# Patient Record
Sex: Female | Born: 1995 | Race: Black or African American | Hispanic: No | Marital: Single | Smoking: Never smoker
Health system: Southern US, Community
[De-identification: ages and names within clinical notes are randomized; demographics above are authoritative.]

## PROBLEM LIST (undated history)

## (undated) DIAGNOSIS — N83209 Unspecified ovarian cyst, unspecified side: Secondary | ICD-10-CM

## (undated) DIAGNOSIS — F419 Anxiety disorder, unspecified: Secondary | ICD-10-CM

## (undated) DIAGNOSIS — F329 Major depressive disorder, single episode, unspecified: Secondary | ICD-10-CM

## (undated) DIAGNOSIS — F32A Depression, unspecified: Secondary | ICD-10-CM

## (undated) DIAGNOSIS — J039 Acute tonsillitis, unspecified: Secondary | ICD-10-CM

## (undated) DIAGNOSIS — J45909 Unspecified asthma, uncomplicated: Secondary | ICD-10-CM

## (undated) DIAGNOSIS — G43909 Migraine, unspecified, not intractable, without status migrainosus: Secondary | ICD-10-CM

## (undated) HISTORY — PX: WISDOM TOOTH EXTRACTION: SHX21

## (undated) HISTORY — DX: Migraine, unspecified, not intractable, without status migrainosus: G43.909

## (undated) HISTORY — DX: Acute tonsillitis, unspecified: J03.90

---
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
## 2016-11-06 ENCOUNTER — Other Ambulatory Visit: Payer: Self-pay | Admitting: Nurse Practitioner

## 2016-11-06 DIAGNOSIS — R1032 Left lower quadrant pain: Secondary | ICD-10-CM

## 2016-11-10 ENCOUNTER — Ambulatory Visit
Admission: RE | Admit: 2016-11-10 | Discharge: 2016-11-10 | Disposition: A | Discharge status: Home / Self Care | Payer: Managed Care, Other (non HMO) | Source: Ambulatory Visit | Attending: Nurse Practitioner | Admitting: Nurse Practitioner

## 2016-11-10 ENCOUNTER — Other Ambulatory Visit: Payer: Self-pay | Admitting: Nurse Practitioner

## 2016-11-10 DIAGNOSIS — R1032 Left lower quadrant pain: Secondary | ICD-10-CM

## 2016-11-15 ENCOUNTER — Ambulatory Visit
Admission: RE | Admit: 2016-11-15 | Discharge: 2016-11-15 | Disposition: A | Discharge status: Home / Self Care | Payer: Managed Care, Other (non HMO) | Source: Ambulatory Visit | Attending: Nurse Practitioner | Admitting: Nurse Practitioner

## 2016-11-15 DIAGNOSIS — R1032 Left lower quadrant pain: Secondary | ICD-10-CM

## 2017-04-10 ENCOUNTER — Encounter (HOSPITAL_COMMUNITY): Payer: Self-pay | Admitting: Family Medicine

## 2017-04-10 ENCOUNTER — Ambulatory Visit (HOSPITAL_COMMUNITY)
Admission: EM | Admit: 2017-04-10 | Discharge: 2017-04-10 | Disposition: A | Discharge status: Home / Self Care | Payer: Managed Care, Other (non HMO) | Attending: Family Medicine | Admitting: Family Medicine

## 2017-04-10 ENCOUNTER — Other Ambulatory Visit: Payer: Self-pay

## 2017-04-10 DIAGNOSIS — M79672 Pain in left foot: Secondary | ICD-10-CM

## 2017-04-10 MED ORDER — PREDNISONE 20 MG PO TABS: ORAL_TABLET | ORAL | 0 refills | Status: AC

## 2017-04-10 NOTE — ED Provider Notes (Signed)
[facility]   [id number] 04/10/17 Arrival Time: 1310   SUBJECTIVE:  Azucena FallenBriana Sabic is a 22 y.o. female who presents to the urgent care with complaint of right foot pain.  Onset was three days ago.  No trauma.  Location below medial malleolus.  Tender to touch.  Patient works at a [employer] and is on her feet much of the day.  Past Medical History:  Diagnosis Date  . Anxiety   . Asthma   . Depression   . Migraine   . Tonsillitis 10/2014   Family History  Problem Relation Age of Onset  . Hypertension Mother   . Cancer Maternal Aunt        breast  . Diabetes Paternal Grandfather    Social History   Socioeconomic History  . Marital status: Single    Spouse name: Not on file  . Number of children: 0  . Years of education: Not on file  . Highest education level: Not on file  Occupational History    Comment: college student, [employer]  Social Needs  . Financial resource strain: Not on file  . Food insecurity:    Worry: Not on file    Inability: Not on file  . Transportation needs:    Medical: Not on file    Non-medical: Not on file  Tobacco Use  . Smoking status: Never Smoker  . Smokeless tobacco: Never Used  Substance and Sexual Activity  . Alcohol use: No    Comment: socially  . Drug use: No    Comment: 09/23/14 twice a year  . Sexual activity: Yes    Birth control/protection: Pill  Lifestyle  . Physical activity:    Days per week: Not on file    Minutes per session: Not on file  . Stress: Not on file  Relationships  . Social connections:    Talks on phone: Not on file    Gets together: Not on file    Attends religious service: Not on file    Active member of club or organization: Not on file    Attends meetings of clubs or organizations: Not on file    Relationship status: Not on file  . Intimate partner violence:    Fear of current or ex partner: Not on file    Emotionally abused: Not on file    Physically abused: Not on file    Forced  sexual activity: Not on file  Other Topics Concern  . Not on file  Social History Narrative   Lives on campus   Caffeine use- very little   No outpatient medications have been marked as taking for the 04/10/17 encounter [facility](Hospital Encounter).   Allergies  Allergen Reactions  . Clindamycin/Lincomycin Hives  . Peanut-Containing Drug Products Swelling    ALL NUTS Throat swelling, lip swelling, hives, vomiting      ROS: As per HPI, remainder of ROS negative.   OBJECTIVE:   Vitals:   04/10/17 1333  BP: (!) 142/101  Pulse: 85  Temp: 98.1 F (36.7 C)  TempSrc: Oral  SpO2: 100%     General appearance: alert; no distress Eyes: PERRL; EOMI; conjunctiva normal HENT: normocephalic; atraumatic; TMs normal, canal normal, external ears normal without trauma; nasal mucosa normal; oral mucosa normal Neck: supple Back: no CVA tenderness Extremities: no cyanosis or edema; symmetrical with no gross deformities Skin: warm and dry Neurologic: normal gait; grossly normal Psychological: alert and cooperative; normal mood and affect      Labs:  Results for orders placed or performed during the hospital encounter of 01/30/15  POC Urine Pregnancy, ED (do NOT order at [facility])  Result Value Ref Range   Preg Test, Ur NEGATIVE NEGATIVE    Labs Reviewed - No data to display  No results found.     ASSESSMENT & PLAN:  1. Foot pain, left     Meds ordered this encounter  Medications  . predniSONE (DELTASONE) 20 MG tablet    Sig: Two daily with food    Dispense:  10 tablet    Refill:  0    Reviewed expectations re: course of current medical issues. Questions answered. Outlined signs and symptoms indicating need for more acute intervention. Patient verbalized understanding. After Visit Summary given.    Procedures:      Elvina Sidle, MD 04/10/17 1340

## 2017-04-10 NOTE — Discharge Instructions (Addendum)
This appears to be tendinitis.  If you are not improving in the next 1-2 days, please return for an x-ray

## 2017-04-10 NOTE — ED Triage Notes (Signed)
Pt reports right foot pain, especially when waking up in the morning and when being on her feet for a long time at work.

## 2017-05-21 ENCOUNTER — Other Ambulatory Visit: Payer: Self-pay

## 2017-05-21 ENCOUNTER — Encounter (HOSPITAL_BASED_OUTPATIENT_CLINIC_OR_DEPARTMENT_OTHER): Payer: Self-pay | Admitting: *Deleted

## 2017-05-23 NOTE — H&P (Signed)
Otolaryngology Clinic Note  HPI:    [name] [name] is a 22 y.o. female patient of Patient Does Not Have Pcp for preop evaluation.  Her nose is worse in the past couple of weeks with obstruction and mucus drainage.  She does  typically have allergies this time of year.  She uses Zyrtec, Flonase, and occasional Benadryl.  I discussed the proposed surgery namely SMR inferior turbinates bilateral.  I discussed the surgery in detail including risks and complications.  Questions were answered and informed consent was obtained. Past Medical History      Past Medical History:  Diagnosis Date  . Multiple allergies       Past Surgical History       Past Surgical History:  Procedure Laterality Date  . TONSILLECTOMY        No family history of bleeding disorders, wound healing problems or difficulty with anesthesia.   Social History  Social History        Social History  . Marital status: Single    Spouse name: N/A  . Number of children: N/A  . Years of education: N/A      Occupational History  . Not on file.       Social History Main Topics  . Smoking status: Never Smoker  . Smokeless tobacco: Never Used  . Alcohol use Not on file  . Drug use: Unknown  . Sexual activity: Not on file       Other Topics Concern  . Not on file      Social History Narrative  . No narrative on file       Current Outpatient Prescriptions:  .  cetirizine (ZYRTEC) 10 MG tablet, Take by mouth., Disp: , Rfl:  .  crisaborole (EUCRISA) 2 % ointment, , Disp: , Rfl:  .  EPINEPHrine (EPIPEN 2-PAK) 0.3 mg/0.3 mL auto-injector, use as directed by prescriber, Disp: , Rfl:  .  fluticasone (FLONASE) 50 mcg/actuation nasal spray, 2 sprays by Each Nare route daily., Disp: 1 Inhaler, Rfl: 5 .  silver sulfADIAZINE (SSD) 1 % cream, SSD 1 % topical cream, Disp: , Rfl:  .  SUMAtriptan (IMITREX) 25 MG tablet, sumatriptan 25 mg tablet, Disp: , Rfl:  .  tacrolimus (PROTOPIC) 0.03 %  ointment, tacrolimus 0.03 % topical ointment, Disp: , Rfl:  .  tacrolimus (PROTOPIC) 0.1 % ointment, apply to affected area twice a day as needed for eczema, Disp: , Rfl:  .  amoxicillin-clavulanate (AUGMENTIN) 500-125 mg per tablet, Take 1 tablet by mouth 2 times daily with meals., Disp: 60 tablet, Rfl: 0 .  cephalexin 500 mg tablet, Take 500 mg by mouth 4 times daily for 5 days., Disp: 20 tablet, Rfl: 0 .  clindamycin (CLEOCIN HCL) 150 MG capsule, Take 1 capsule (150 mg total) by mouth 3 times daily., Disp: 90 capsule, Rfl: 0 .  HYDROcodone-acetaminophen (NORCO) 5-325 mg per tablet, Take 1-2 tablets by mouth every 6 (six) hours as needed for up to 5 days for Pain., Disp: 30 tablet, Rfl: 0 .  metroNIDAZOLE (FLAGYL) 250 MG tablet, Take 1 tablet (250 mg total) by mouth 3 times daily., Disp: 90 tablet, Rfl: 1  A complete ROS was performed with pertinent positives/negatives noted in the HPI. The remainder of the ROS are negative.    Physical Exam:    There were no vitals taken for this visit. She is heavyset.  The inferior turbinates are swollen and completely obstructing, right greater than left. Lungs: Clear to auscultation Heart: Regular  rate and rhythm without murmurs Abdomen: Soft, active Extremities: Normal configuration Neurologic: Symmetric, grossly intact.      Impression & Plans:   Hypertrophic inferior turbinates with obstruction.  Plan: Proceed with surgery.  I sent in prescriptions for hydrocodone and Keflex.  I will remove nasal packing 1 day postop.   Fernande Boyden, MD  05/22/2017

## 2017-05-28 ENCOUNTER — Ambulatory Visit (HOSPITAL_BASED_OUTPATIENT_CLINIC_OR_DEPARTMENT_OTHER): Payer: Managed Care, Other (non HMO) | Admitting: Certified Registered"

## 2017-05-28 ENCOUNTER — Encounter (HOSPITAL_BASED_OUTPATIENT_CLINIC_OR_DEPARTMENT_OTHER)
Admission: RE | Disposition: A | Discharge status: Home / Self Care | Payer: Self-pay | Source: Ambulatory Visit | Attending: Otolaryngology

## 2017-05-28 ENCOUNTER — Ambulatory Visit (HOSPITAL_BASED_OUTPATIENT_CLINIC_OR_DEPARTMENT_OTHER)
Admission: RE | Admit: 2017-05-28 | Discharge: 2017-05-28 | Disposition: A | Discharge status: Home / Self Care | Payer: Managed Care, Other (non HMO) | Source: Ambulatory Visit | Attending: Otolaryngology | Admitting: Otolaryngology

## 2017-05-28 ENCOUNTER — Other Ambulatory Visit: Payer: Self-pay

## 2017-05-28 ENCOUNTER — Encounter (HOSPITAL_BASED_OUTPATIENT_CLINIC_OR_DEPARTMENT_OTHER): Payer: Self-pay | Admitting: Emergency Medicine

## 2017-05-28 DIAGNOSIS — Z7951 Long term (current) use of inhaled steroids: Secondary | ICD-10-CM | POA: Insufficient documentation

## 2017-05-28 DIAGNOSIS — J45909 Unspecified asthma, uncomplicated: Secondary | ICD-10-CM | POA: Diagnosis not present

## 2017-05-28 DIAGNOSIS — Z79899 Other long term (current) drug therapy: Secondary | ICD-10-CM | POA: Insufficient documentation

## 2017-05-28 DIAGNOSIS — F418 Other specified anxiety disorders: Secondary | ICD-10-CM | POA: Insufficient documentation

## 2017-05-28 DIAGNOSIS — J343 Hypertrophy of nasal turbinates: Secondary | ICD-10-CM | POA: Insufficient documentation

## 2017-05-28 HISTORY — PX: TURBINATE REDUCTION: SHX6157

## 2017-05-28 LAB — POCT PREGNANCY, URINE: Preg Test, Ur: NEGATIVE

## 2017-05-28 SURGERY — REDUCTION, NASAL TURBINATE
Anesthesia: General | Site: Nose | Laterality: Bilateral

## 2017-05-28 MED ORDER — LIDOCAINE-EPINEPHRINE 1 %-1:100000 IJ SOLN
INTRAMUSCULAR | Status: AC
  Filled 2017-05-28: qty 1

## 2017-05-28 MED ORDER — NEOSTIGMINE METHYLSULFATE 10 MG/10ML IV SOLN
INTRAVENOUS | Status: DC | PRN
  Administered 2017-05-28: 4 mg via INTRAVENOUS

## 2017-05-28 MED ORDER — OXYMETAZOLINE HCL 0.05 % NA SOLN
NASAL | Status: AC
  Filled 2017-05-28: qty 15

## 2017-05-28 MED ORDER — OXYCODONE HCL 5 MG PO TABS
5.0000 mg | ORAL_TABLET | Freq: Once | ORAL | Status: AC | PRN
  Administered 2017-05-28: 5 mg via ORAL

## 2017-05-28 MED ORDER — FENTANYL CITRATE (PF) 100 MCG/2ML IJ SOLN
INTRAMUSCULAR | Status: AC
  Filled 2017-05-28: qty 2

## 2017-05-28 MED ORDER — PROMETHAZINE HCL 25 MG/ML IJ SOLN: 6.2500 mg | INTRAMUSCULAR | Status: DC | PRN

## 2017-05-28 MED ORDER — LIDOCAINE HCL (CARDIAC) PF 100 MG/5ML IV SOSY
PREFILLED_SYRINGE | INTRAVENOUS | Status: DC | PRN
  Administered 2017-05-28: 100 mg via INTRAVENOUS

## 2017-05-28 MED ORDER — DEXAMETHASONE SODIUM PHOSPHATE 10 MG/ML IJ SOLN
10.0000 mg | Freq: Once | INTRAMUSCULAR | Status: AC
  Administered 2017-05-28: 10 mg via INTRAVENOUS

## 2017-05-28 MED ORDER — MIDAZOLAM HCL 2 MG/2ML IJ SOLN
INTRAMUSCULAR | Status: AC
  Filled 2017-05-28: qty 2

## 2017-05-28 MED ORDER — BACITRACIN-NEOMYCIN-POLYMYXIN OINTMENT TUBE
TOPICAL_OINTMENT | CUTANEOUS | Status: DC | PRN
  Administered 2017-05-28: 1 via TOPICAL

## 2017-05-28 MED ORDER — ONDANSETRON HCL 4 MG/2ML IJ SOLN
INTRAMUSCULAR | Status: AC
  Filled 2017-05-28: qty 2

## 2017-05-28 MED ORDER — LACTATED RINGERS IV SOLN
INTRAVENOUS | Status: DC
  Administered 2017-05-28: 09:00:00 via INTRAVENOUS

## 2017-05-28 MED ORDER — OXYCODONE HCL 5 MG PO TABS
ORAL_TABLET | ORAL | Status: AC
  Filled 2017-05-28: qty 1

## 2017-05-28 MED ORDER — CEFAZOLIN SODIUM-DEXTROSE 2-3 GM-%(50ML) IV SOLR
INTRAVENOUS | Status: DC | PRN
  Administered 2017-05-28: 2 g via INTRAVENOUS

## 2017-05-28 MED ORDER — NEOSTIGMINE METHYLSULFATE 5 MG/5ML IV SOSY
PREFILLED_SYRINGE | INTRAVENOUS | Status: AC
  Filled 2017-05-28: qty 5

## 2017-05-28 MED ORDER — MIDAZOLAM HCL 2 MG/2ML IJ SOLN
1.0000 mg | INTRAMUSCULAR | Status: DC | PRN
  Administered 2017-05-28: 2 mg via INTRAVENOUS

## 2017-05-28 MED ORDER — LIDOCAINE HCL (CARDIAC) PF 100 MG/5ML IV SOSY
PREFILLED_SYRINGE | INTRAVENOUS | Status: AC
Start: 2017-05-28 — End: ?
  Filled 2017-05-28: qty 5

## 2017-05-28 MED ORDER — OXYMETAZOLINE HCL 0.05 % NA SOLN
2.0000 | NASAL | Status: DC | PRN
  Administered 2017-05-28: 2 via NASAL

## 2017-05-28 MED ORDER — PROPOFOL 10 MG/ML IV BOLUS
INTRAVENOUS | Status: DC | PRN
  Administered 2017-05-28: 200 mg via INTRAVENOUS

## 2017-05-28 MED ORDER — HYDROMORPHONE HCL 1 MG/ML IJ SOLN
0.2500 mg | INTRAMUSCULAR | Status: DC | PRN
  Administered 2017-05-28 (×2): 0.25 mg via INTRAVENOUS

## 2017-05-28 MED ORDER — LACTATED RINGERS IV SOLN
500.0000 mL | INTRAVENOUS | Status: DC
  Administered 2017-05-28: 500 mL via INTRAVENOUS

## 2017-05-28 MED ORDER — CEFAZOLIN SODIUM-DEXTROSE 2-4 GM/100ML-% IV SOLN
2.0000 g | INTRAVENOUS | Status: AC
  Administered 2017-05-28: 2 g via INTRAVENOUS

## 2017-05-28 MED ORDER — ROCURONIUM BROMIDE 50 MG/5ML IV SOLN
INTRAVENOUS | Status: AC
  Filled 2017-05-28: qty 1

## 2017-05-28 MED ORDER — LIDOCAINE-EPINEPHRINE 1 %-1:100000 IJ SOLN
INTRAMUSCULAR | Status: DC | PRN
  Administered 2017-05-28: 6 mL

## 2017-05-28 MED ORDER — HYDROMORPHONE HCL 1 MG/ML IJ SOLN
INTRAMUSCULAR | Status: AC
  Filled 2017-05-28: qty 0.5

## 2017-05-28 MED ORDER — GLYCOPYRROLATE 0.2 MG/ML IJ SOLN
INTRAMUSCULAR | Status: DC | PRN
  Administered 2017-05-28: 0.6 mg via INTRAVENOUS

## 2017-05-28 MED ORDER — CEFAZOLIN SODIUM-DEXTROSE 2-4 GM/100ML-% IV SOLN
INTRAVENOUS | Status: AC
  Filled 2017-05-28: qty 100

## 2017-05-28 MED ORDER — FENTANYL CITRATE (PF) 100 MCG/2ML IJ SOLN
50.0000 ug | INTRAMUSCULAR | Status: DC | PRN
  Administered 2017-05-28: 100 ug via INTRAVENOUS

## 2017-05-28 MED ORDER — ONDANSETRON HCL 4 MG/2ML IJ SOLN
INTRAMUSCULAR | Status: DC | PRN
  Administered 2017-05-28: 4 mg via INTRAVENOUS

## 2017-05-28 MED ORDER — MEPERIDINE HCL 25 MG/ML IJ SOLN: 6.2500 mg | INTRAMUSCULAR | Status: DC | PRN

## 2017-05-28 MED ORDER — ROCURONIUM BROMIDE 100 MG/10ML IV SOLN
INTRAVENOUS | Status: DC | PRN
  Administered 2017-05-28: 30 mg via INTRAVENOUS

## 2017-05-28 MED ORDER — DEXAMETHASONE SODIUM PHOSPHATE 10 MG/ML IJ SOLN
INTRAMUSCULAR | Status: AC
Start: 2017-05-28 — End: ?
  Filled 2017-05-28: qty 1

## 2017-05-28 MED ORDER — SCOPOLAMINE 1 MG/3DAYS TD PT72: 1.0000 | MEDICATED_PATCH | Freq: Once | TRANSDERMAL | Status: DC | PRN

## 2017-05-28 MED ORDER — OXYCODONE HCL 5 MG/5ML PO SOLN: 5.0000 mg | Freq: Once | ORAL | Status: AC | PRN

## 2017-05-28 MED ORDER — PROPOFOL 10 MG/ML IV BOLUS
INTRAVENOUS | Status: AC
  Filled 2017-05-28: qty 20

## 2017-05-28 MED ORDER — OXYMETAZOLINE HCL 0.05 % NA SOLN
NASAL | Status: DC | PRN
  Administered 2017-05-28: 1 via TOPICAL

## 2017-05-28 MED ORDER — BACITRACIN-NEOMYCIN-POLYMYXIN 400-5-5000 EX OINT
TOPICAL_OINTMENT | CUTANEOUS | Status: AC
  Filled 2017-05-28: qty 1

## 2017-05-28 SURGICAL SUPPLY — 33 items
AIRWAY NASO PHAR 26FR 6.5 (TUBING)
AIRWAY NASOPHAR 26 6.5 (TUBING) IMPLANT
ATTRACTOMAT 16X20 MAGNETIC DRP (DRAPES) IMPLANT
CANISTER SUCT 1200ML W/VALVE (MISCELLANEOUS) ×3 IMPLANT
COAGULATOR SUCT 8FR VV (MISCELLANEOUS) ×3 IMPLANT
DECANTER SPIKE VIAL GLASS SM (MISCELLANEOUS) IMPLANT
DEPRESSOR TONGUE BLADE STERILE (MISCELLANEOUS) ×6 IMPLANT
DRSG NASOPORE 8CM (GAUZE/BANDAGES/DRESSINGS) IMPLANT
DRSG TELFA 3X8 NADH (GAUZE/BANDAGES/DRESSINGS) ×3 IMPLANT
ELECT REM PT RETURN 9FT ADLT (ELECTROSURGICAL) ×3
ELECTRODE REM PT RTRN 9FT ADLT (ELECTROSURGICAL) ×1 IMPLANT
GAUZE PACKING FOLDED 2  STR (GAUZE/BANDAGES/DRESSINGS) ×2
GAUZE PACKING FOLDED 2 STR (GAUZE/BANDAGES/DRESSINGS) ×1 IMPLANT
GLOVE ECLIPSE 8.0 STRL XLNG CF (GLOVE) IMPLANT
GLOVE SURG SS PI 7.0 STRL IVOR (GLOVE) ×3 IMPLANT
GLOVE SURG SS PI 8.0 STRL IVOR (GLOVE) ×6 IMPLANT
GOWN STRL REUS W/ TWL LRG LVL3 (GOWN DISPOSABLE) ×1 IMPLANT
GOWN STRL REUS W/ TWL XL LVL3 (GOWN DISPOSABLE) ×1 IMPLANT
GOWN STRL REUS W/TWL LRG LVL3 (GOWN DISPOSABLE) ×2
GOWN STRL REUS W/TWL XL LVL3 (GOWN DISPOSABLE) ×2
NEEDLE HYPO 25X1 1.5 SAFETY (NEEDLE) ×3 IMPLANT
NEEDLE SPNL 25GX3.5 QUINCKE BL (NEEDLE) ×3 IMPLANT
NS IRRIG 1000ML POUR BTL (IV SOLUTION) ×3 IMPLANT
PACK BASIN DAY SURGERY FS (CUSTOM PROCEDURE TRAY) ×3 IMPLANT
PACK ENT DAY SURGERY (CUSTOM PROCEDURE TRAY) ×3 IMPLANT
PATTIES SURGICAL .5 X3 (DISPOSABLE) ×3 IMPLANT
SLEEVE SCD COMPRESS KNEE MED (MISCELLANEOUS) ×3 IMPLANT
SPONGE GAUZE 2X2 8PLY STER LF (GAUZE/BANDAGES/DRESSINGS) ×1
SPONGE GAUZE 2X2 8PLY STRL LF (GAUZE/BANDAGES/DRESSINGS) ×2 IMPLANT
SUT ETHILON 3 0 PS 1 (SUTURE) IMPLANT
TOWEL OR 17X24 6PK STRL BLUE (TOWEL DISPOSABLE) ×3 IMPLANT
TRAY DSU PREP LF (CUSTOM PROCEDURE TRAY) ×3 IMPLANT
YANKAUER SUCT BULB TIP NO VENT (SUCTIONS) ×3 IMPLANT

## 2017-05-28 NOTE — Anesthesia Procedure Notes (Signed)
Procedure Name: Intubation Performed by: Nabria Nevin M, CRNA Pre-anesthesia Checklist: Patient identified, Emergency Drugs available, Suction available, Patient being monitored and Timeout performed Patient Re-evaluated:Patient Re-evaluated prior to induction Oxygen Delivery Method: Circle system utilized Preoxygenation: Pre-oxygenation with 100% oxygen Induction Type: IV induction Ventilation: Mask ventilation without difficulty Laryngoscope Size: Mac and 3 Grade View: Grade I Tube type: Oral Tube size: 7.0 mm Number of attempts: 1 Airway Equipment and Method: Stylet Placement Confirmation: ETT inserted through vocal cords under direct vision,  positive ETCO2,  CO2 detector and breath sounds checked- equal and bilateral Secured at: 22 cm Tube secured with: Tape Dental Injury: Teeth and Oropharynx as per pre-operative assessment        

## 2017-05-28 NOTE — Interval H&P Note (Signed)
History and Physical Interval Note:  05/28/2017 8:53 AM  [name] [name]  has presented today for surgery, with the diagnosis of HYPERTROPHIC TURBINATE  The various methods of treatment have been discussed with the patient and family. After consideration of risks, benefits and other options for treatment, the patient has consented to  Procedure(s): BILATERAL TURBINATE REDUCTION (Bilateral) as a surgical intervention .  The patient's history has been reviewed, patient examined, no change in status, stable for surgery.  I have reviewed the patient's chart and labs.  Questions were answered to the patient's satisfaction.     [name]

## 2017-05-28 NOTE — Op Note (Signed)
05/28/2017  9:46 AM    [name] [name]  [id number]   Pre-Op Dx:  Hypertrophic Inferior Turbinateswith obstruction  Post-op Dx: Same  Proc: Bilateral SMR Inferior Turbinates   Surg:  [name] T MD  Anes:  GOT  EBL:  minimal  Comp:  none  Findings:  Very bulky anterior pole of the inferior turbinate on both sides presenting into the nasal vestibule. Heavy bony turbinates more posteriorly.  Procedure: With the patient in a comfortable supine position,  general orotracheal anesthesia was induced without difficulty.     The patient received preoperative Afrin spray for topical decongestion and vasoconstriction.  Intravenous prophylactic antibiotics were administered.  At an appropriate level, the patient was placed in a semi-sitting position.  A saline moistened throat pack was placed.  Nasal vibrissae were trimmed.   Afrin  solution was applied on 0.5" x 3" cottonoids to both sides of the septal mucosa.   1% Xylocaine with 1:100,000 epinephrine, 6 cc's, was infiltrated into the inferior turbinate on each side..  Several minutes were allowed for this to take effect.  A sterile preparation and draping of the midface was accomplished in the standard fashion.  The materials were removed from the nose and observed to be intact and correct in number.  The nose was inspected with a headlight with the findings as described above.   beginning on the RIGHT side, the inferior turbinate was inspected and infractured.  The anterior hood of the inferior turbinate was sharply lysed just behind the nasal valve.  The medial mucosa of the inferior turbinate was incised in an  anterior upsloping fashion and a laterally based flap was developed from the turbinate bone.  Using angled turbinate scissors, turbinate bone and lateral mucosa were resected in a posterior downsloping fashion, taking much of the anterior pole and leaving most of the posterior pole.  Bulky Bony spicules were submucosally  dissected and removed.  The mucosal flap was laid back down and the turbinate was outfractured.  This completed one SMR inferior turbinate.  The opposite side was performed in identical fashion.  The cut mucosal edges were suction coagulated on both sides for hemostasis.  Again hemostasis was observed.  Telfa packs impregnated with bacitracin ointment were placed between the septum and the inferior turbinates, one on each side, for hemostasis and support.     At this point the procedure was completed.  The pharynx was suctioned free and the throat pack was removed.   The patient was returned to anesthesia, awakened, extubated, and transferred to recovery in stable condition.  Dispo:   PACU to home  Plan: Ice, elevation, narcotic analgesia.   We will remove the nasal packing In one day.  Return to work or school in 10 days, strenuous activities in two weeks.  Cephus Richer MD

## 2017-05-28 NOTE — Anesthesia Postprocedure Evaluation (Signed)
Anesthesia Post Note  Patient: [name] [name]  Procedure(s) Performed: BILATERAL TURBINATE REDUCTION (Bilateral Nose)     Patient location during evaluation: PACU Anesthesia Type: General Level of consciousness: sedated and patient cooperative Pain management: pain level controlled Vital Signs Assessment: post-procedure vital signs reviewed and stable Respiratory status: spontaneous breathing Cardiovascular status: stable Anesthetic complications: no    Last Vitals:  Vitals:   05/28/17 1051 05/28/17 1100  BP: (!) 146/101 (!) 135/100  Pulse:  70  Resp:  18  Temp:  36.5 C  SpO2:  100%    Last Pain:  Vitals:   05/28/17 1100  TempSrc:   PainSc: 2                  Lewie Loron

## 2017-05-28 NOTE — Anesthesia Preprocedure Evaluation (Signed)
Anesthesia Evaluation  Patient identified by MRN, date of birth, ID band Patient awake    Reviewed: Allergy & Precautions, NPO status , Patient's Chart, lab work & pertinent test results  Airway Mallampati: II  TM Distance: >3 FB Neck ROM: Full    Dental  (+) Teeth Intact, Dental Advisory Given   Pulmonary asthma ,    breath sounds clear to auscultation       Cardiovascular negative cardio ROS   Rhythm:Regular Rate:Normal     Neuro/Psych  Headaches, Anxiety Depression    GI/Hepatic negative GI ROS, Neg liver ROS,   Endo/Other  negative endocrine ROS  Renal/GU negative Renal ROS     Musculoskeletal negative musculoskeletal ROS (+)   Abdominal   Peds  Hematology negative hematology ROS (+)   Anesthesia Other Findings   Reproductive/Obstetrics                             Anesthesia Physical  Anesthesia Plan  ASA: II  Anesthesia Plan: General   Post-op Pain Management:    Induction: Intravenous  PONV Risk Score and Plan: 4 or greater and Ondansetron, Dexamethasone, Midazolam and Scopolamine patch - Pre-op  Airway Management Planned: Oral ETT  Additional Equipment: None  Intra-op Plan:   Post-operative Plan: Extubation in OR  Informed Consent: I have reviewed the patients History and Physical, chart, labs and discussed the procedure including the risks, benefits and alternatives for the proposed anesthesia with the patient or authorized representative who has indicated his/her understanding and acceptance.   Dental advisory given  Plan Discussed with: CRNA  Anesthesia Plan Comments:         Anesthesia Quick Evaluation

## 2017-05-28 NOTE — Transfer of Care (Signed)
Immediate Anesthesia Transfer of Care Note  Patient: [name] [name]  Procedure(s) Performed: BILATERAL TURBINATE REDUCTION (Bilateral Nose)  Patient Location: PACU  Anesthesia Type:General  Level of Consciousness: awake, alert  and oriented  Airway & Oxygen Therapy: Patient Spontanous Breathing and Patient connected to face mask oxygen  Post-op Assessment: Report given to RN and Post -op Vital signs reviewed and stable  Post vital signs: Reviewed and stable  Last Vitals:  Vitals Value Taken Time  BP    Temp    Pulse 100 05/28/2017  9:52 AM  Resp 22 05/28/2017  9:52 AM  SpO2 100 % 05/28/2017  9:52 AM  Vitals shown include unvalidated device data.  Last Pain:  Vitals:   05/28/17 0820  TempSrc: Oral  PainSc: 0-No pain         Complications: No apparent anesthesia complications

## 2017-05-28 NOTE — Discharge Instructions (Signed)
Ice pack x 24 hrs Keep head elevated 3-4 nights OK for gentle activity right away.  OK to shower/bathe later today or tomorrow Recheck my office 1 day for pack removal [phone]).  Take a dose of pain medication before this, and have someone drive you please.Begin nasal hygiene measures after packs are out tomorrow.  Oxycodone given at 10:55a OK to change the drip pad as often as desired. OK to rinse throat with cool dilute salt water to clear old blood and thick phlegm.   Call for active bleeding, excessive pain. [phone].     Post Anesthesia Home Care Instructions  Activity: Get plenty of rest for the remainder of the day. A responsible individual must stay with you for 24 hours following the procedure.  For the next 24 hours, DO NOT: -Drive a car -Advertising copywriter -Drink alcoholic beverages -Take any medication unless instructed by your physician -Make any legal decisions or sign important papers.  Meals: Start with liquid foods such as gelatin or soup. Progress to regular foods as tolerated. Avoid greasy, spicy, heavy foods. If nausea and/or vomiting occur, drink only clear liquids until the nausea and/or vomiting subsides. Call your physician if vomiting continues.  Special Instructions/Symptoms: Your throat may feel dry or sore from the anesthesia or the breathing tube placed in your throat during surgery. If this causes discomfort, gargle with warm salt water. The discomfort should disappear within 24 hours.  If you had a scopolamine patch placed behind your ear for the management of post- operative nausea and/or vomiting:  1. The medication in the patch is effective for 72 hours, after which it should be removed.  Wrap patch in a tissue and discard in the trash. Wash hands thoroughly with soap and water. 2. You may remove the patch earlier than 72 hours if you experience unpleasant side effects which may include dry mouth, dizziness or visual disturbances. 3. Avoid  touching the patch. Wash your hands with soap and water after contact with the patch.         Post Anesthesia Home Care Instructions  Activity: Get plenty of rest for the remainder of the day. A responsible individual must stay with you for 24 hours following the procedure.  For the next 24 hours, DO NOT: -Drive a car -Advertising copywriter -Drink alcoholic beverages -Take any medication unless instructed by your physician -Make any legal decisions or sign important papers.  Meals: Start with liquid foods such as gelatin or soup. Progress to regular foods as tolerated. Avoid greasy, spicy, heavy foods. If nausea and/or vomiting occur, drink only clear liquids until the nausea and/or vomiting subsides. Call your physician if vomiting continues.  Special Instructions/Symptoms: Your throat may feel dry or sore from the anesthesia or the breathing tube placed in your throat during surgery. If this causes discomfort, gargle with warm salt water. The discomfort should disappear within 24 hours.  If you had a scopolamine patch placed behind your ear for the management of post- operative nausea and/or vomiting:  1. The medication in the patch is effective for 72 hours, after which it should be removed.  Wrap patch in a tissue and discard in the trash. Wash hands thoroughly with soap and water. 2. You may remove the patch earlier than 72 hours if you experience unpleasant side effects which may include dry mouth, dizziness or visual disturbances. 3. Avoid touching the patch. Wash your hands with soap and water after contact with the patch.

## 2017-05-28 NOTE — Interval H&P Note (Signed)
History and Physical Interval Note:  05/28/2017 8:53 AM  [name] [name]  has presented today for surgery, with the diagnosis of HYPERTROPHIC TURBINATE  The various methods of treatment have been discussed with the patient and family. After consideration of risks, benefits and other options for treatment, the patient has consented to  Procedure(s): BILATERAL TURBINATE REDUCTION (Bilateral) as a surgical intervention .  The patient's history has been re-reviewed, patient re-examined, no change in status, stable for surgery.  I have re-reviewed the patient's chart and labs.  Questions were answered to the patient's satisfaction.     [name]

## 2017-05-29 ENCOUNTER — Encounter (HOSPITAL_BASED_OUTPATIENT_CLINIC_OR_DEPARTMENT_OTHER): Payer: Self-pay | Admitting: Otolaryngology

## 2017-08-27 ENCOUNTER — Encounter (HOSPITAL_COMMUNITY): Payer: Self-pay | Admitting: Emergency Medicine

## 2017-08-27 ENCOUNTER — Ambulatory Visit (HOSPITAL_COMMUNITY)
Admission: EM | Admit: 2017-08-27 | Discharge: 2017-08-27 | Disposition: A | Discharge status: Home / Self Care | Payer: Managed Care, Other (non HMO) | Attending: Family Medicine | Admitting: Family Medicine

## 2017-08-27 DIAGNOSIS — B9789 Other viral agents as the cause of diseases classified elsewhere: Secondary | ICD-10-CM

## 2017-08-27 DIAGNOSIS — Z881 Allergy status to other antibiotic agents status: Secondary | ICD-10-CM | POA: Diagnosis not present

## 2017-08-27 DIAGNOSIS — J029 Acute pharyngitis, unspecified: Secondary | ICD-10-CM

## 2017-08-27 DIAGNOSIS — G43909 Migraine, unspecified, not intractable, without status migrainosus: Secondary | ICD-10-CM | POA: Insufficient documentation

## 2017-08-27 DIAGNOSIS — R509 Fever, unspecified: Secondary | ICD-10-CM | POA: Insufficient documentation

## 2017-08-27 DIAGNOSIS — J028 Acute pharyngitis due to other specified organisms: Secondary | ICD-10-CM | POA: Insufficient documentation

## 2017-08-27 DIAGNOSIS — J45909 Unspecified asthma, uncomplicated: Secondary | ICD-10-CM | POA: Insufficient documentation

## 2017-08-27 DIAGNOSIS — Z8249 Family history of ischemic heart disease and other diseases of the circulatory system: Secondary | ICD-10-CM | POA: Insufficient documentation

## 2017-08-27 LAB — POCT RAPID STREP A: Streptococcus, Group A Screen (Direct): NEGATIVE

## 2017-08-27 MED ORDER — ACETAMINOPHEN 325 MG PO TABS
ORAL_TABLET | ORAL | Status: AC
  Filled 2017-08-27: qty 2

## 2017-08-27 MED ORDER — LIDOCAINE VISCOUS HCL 2 % MT SOLN: 15.0000 mL | OROMUCOSAL | 0 refills | Status: AC | PRN

## 2017-08-27 MED ORDER — ACETAMINOPHEN 325 MG PO TABS
650.0000 mg | ORAL_TABLET | Freq: Once | ORAL | Status: AC
  Administered 2017-08-27: 650 mg via ORAL

## 2017-08-27 NOTE — Discharge Instructions (Addendum)
It was nice meeting you!!  Your strep was negative. We will send for culture.  This is most likely a viral infection.  Tylenol and ibuprofen for the pain and fever. Lidocaine for your throat. You can also try hot tea with honey or throat lozenges.

## 2017-08-27 NOTE — ED Provider Notes (Signed)
[facility]    CSN: [id number] Arrival date & time: 08/27/17  1230     History   Chief Complaint Chief Complaint  Patient presents with  . Fever    HPI [name] [name] is a 22 y.o. female.   Patient is a 22 year old female with past medical history of asthma, migraine, tonsillitis, tonsillectomy and adenoidectomy.  She presents with fever, body aches, sore throat, chills since last night.  Her symptoms have been constant and worsening with fever that started this morning.  Low-grade fever at home and here fever was 102.9.  She has not taken anything for symptoms.  She denies any cough, congestion, nausea.  She has a history of strep as a child.   ROS per HPI     .  Past Medical History:  Diagnosis Date  . Asthma   . Migraine   . Tonsillitis 10/2014    Patient Active Problem List   Diagnosis Date Noted  . Chronic tonsillitis 11/23/2014  . 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 ELBOW SURGERY Right 2007  . TONSILLECTOMY AND ADENOIDECTOMY Bilateral 11/23/2014   Procedure: TONSILLECTOMY AND ADENOIDECTOMY;  Surgeon: Flo ShanksKarol Wolicki, MD;  Location: [facility];  Service: ENT;  Laterality: Bilateral;  . TURBINATE REDUCTION Bilateral 05/28/2017   Procedure: BILATERAL TURBINATE REDUCTION;  Surgeon: Flo ShanksWolicki, Karol, MD;  Location: [facility];  Service: ENT;  Laterality: Bilateral;  . WISDOM TOOTH EXTRACTION      OB History   None      Home Medications    Prior to Admission medications   Medication Sig Start Date End Date Taking? Authorizing Provider  acetaminophen (TYLENOL) 500 MG tablet Take 1 tablet (500 mg total) by mouth every 6 (six) hours as needed. 10/31/14   Mady GemmaWestfall, Elizabeth C, PA-C  cetirizine (ZYRTEC) 10 MG tablet Take 10 mg by mouth daily.    [provider]  EPIPEN 2-PAK 0.3 MG/0.3ML SOAJ injection use as directed by prescriber 08/05/14    [provider]  etonogestrel (NEXPLANON) 68 MG IMPL implant 1 each by Subdermal route once.    [provider]  hydrOXYzine (ATARAX/VISTARIL) 10 MG tablet TAKE 1 TO 3 TABLETS BY MOUTH EVERY 6-8 HOURS AS NEEDED FOR ITCHING 04/28/15   [provider]  ibuprofen (ADVIL,MOTRIN) 800 MG tablet Take 1 tablet (800 mg total) by mouth 3 (three) times daily. 10/31/14   Mady GemmaWestfall, Elizabeth C, PA-C  lidocaine (XYLOCAINE) 2 % solution Use as directed 15 mLs in the mouth or throat as needed for mouth pain. 08/27/17   Janace ArisBast, Jereme Loren A, NP  predniSONE (DELTASONE) 20 MG tablet Two daily with food Patient not taking: Reported on 08/27/2017 04/10/17   Elvina SidleLauenstein, Kurt, MD    Family History Family History  Problem Relation Age of Onset  . Hypertension Mother   . Cancer Maternal Aunt        breast  . Diabetes Paternal Grandfather     Social History Social History   Tobacco Use  . Smoking status: Never Smoker  . Smokeless tobacco: Never Used  Substance Use Topics  . Alcohol use: No    Comment: socially  . Drug use: No    Comment: 09/23/14 twice a year     Allergies   Clindamycin/lincomycin; Peanut-containing drug products; and Latex   Review of Systems Review of Systems   Physical Exam Triage Vital Signs ED Triage Vitals [08/27/17 1302]  Enc Vitals  Group     BP 122/88     Pulse Rate (!) 117     Resp 18     Temp (!) 102.9 F (39.4 C)     Temp Source Oral     SpO2 98 %     Weight      Height      Head Circumference      Peak Flow      Pain Score      Pain Loc      Pain Edu?      Excl. in GC?    No data found.  Updated Vital Signs BP 122/88 (BP Location: Right Arm)   Pulse (!) 117   Temp (!) 102.9 F (39.4 C) (Oral)   Resp 18   SpO2 98%   Visual Acuity Right Eye Distance:   Left Eye Distance:   Bilateral Distance:    Right Eye Near:   Left Eye Near:    Bilateral Near:     Physical Exam  Constitutional: She is oriented to person, place, and  time. She appears well-developed and well-nourished.  HENT:  Head: Normocephalic and atraumatic.  Bilateral TMs normal.  Erythematous posterior oropharynx.  Tonsils missing.  No exudates, lesions.  Drainage noted in the back of throat.   Eyes: Pupils are equal, round, and reactive to light. Conjunctivae are normal.  Neck: Normal range of motion.  Pulmonary/Chest: Effort normal.  Musculoskeletal: Normal range of motion.  Lymphadenopathy:    She has cervical adenopathy.  Neurological: She is alert and oriented to person, place, and time.  Skin: Skin is warm and dry.  Psychiatric: She has a normal mood and affect.  Nursing note and vitals reviewed.    UC Treatments / Results  Labs (all labs ordered are listed, but only abnormal results are displayed) Labs Reviewed  CULTURE, GROUP A STREP [facility])  POCT RAPID STREP A    EKG None  Radiology No results found.  Procedures Procedures (including critical care time)  Medications Ordered in UC Medications  acetaminophen (TYLENOL) tablet 650 mg (650 mg Oral Given 08/27/17 1304)    Initial Impression / Assessment and Plan / UC Course  I have reviewed the triage vital signs and the nursing notes.  Pertinent labs & imaging results that were available during my care of the patient were reviewed by me and considered in my medical decision making (see chart for details).     Viral pharyngitis- strep test negative.  Symptomatic treatment. Tylenol given in clinic for fever.   Ibuprofen and tylenol for pain and fever. Lidocaine viscous prescribed for pain in the throat.  Final Clinical Impressions(s) / UC Diagnoses   Final diagnoses:  Acute viral pharyngitis     Discharge Instructions     It was nice meeting you!!  Your strep was negative. We will send for culture.  This is most likely a viral infection.  Tylenol and ibuprofen for the pain and fever. Lidocaine for your throat. You can also try hot tea with honey or throat  lozenges.      ED Prescriptions    Medication Sig Dispense Auth. Provider   lidocaine (XYLOCAINE) 2 % solution Use as directed 15 mLs in the mouth or throat as needed for mouth pain. 100 mL Dahlia ByesBast, Brithany Whitworth A, NP     Controlled Substance Prescriptions  Controlled Substance Registry consulted? Not Applicable   Janace ArisBast, Maryiah Olvey A, NP 08/27/17 1409

## 2017-08-27 NOTE — ED Triage Notes (Signed)
Pt here for fever starting last night

## 2017-08-29 LAB — CULTURE, GROUP A STREP (THRC)

## 2017-08-30 ENCOUNTER — Telehealth (HOSPITAL_COMMUNITY): Payer: Self-pay

## 2017-08-30 MED ORDER — FLUCONAZOLE 150 MG PO TABS: 150.0000 mg | ORAL_TABLET | Freq: Every day | ORAL | 0 refills | Status: AC

## 2017-08-30 MED ORDER — PENICILLIN V POTASSIUM 500 MG PO TABS: 500.0000 mg | ORAL_TABLET | Freq: Two times a day (BID) | ORAL | 0 refills | Status: AC

## 2017-08-30 NOTE — Telephone Encounter (Signed)
Culture is positive for non group A Strep germ.  This is a finding of uncertain significance; not the typical 'strep throat' germ.  Pt complains of persistent symptoms.  Rx for penicillin V 500mg  bid x 10d #20 no refills sent to pharmacy of patients choice. Pt verbalized understanding. Recheck for further evaluation if symptoms are not improving.  Pt reports history of yeast infections with antibiotics. rx for diflucan sent to pharmacy of choice.

## 2017-09-25 ENCOUNTER — Telehealth: Payer: Self-pay | Admitting: Diagnostic Neuroimaging

## 2017-09-25 NOTE — Telephone Encounter (Signed)
Pt called stating she has been having troubles sleeping and having other symptoms. Would like a follow up appt with Dr. Marjory LiesPenumalli but this is a new issue and pt does not have a PCP to send a referral. Requesting a call on how to move forward.

## 2017-09-26 NOTE — Telephone Encounter (Signed)
Pt called stating she doesn't have a PCP so is unsure on how to move forward, will be in class until 12:15 please call to advise

## 2017-09-26 NOTE — Telephone Encounter (Signed)
I spoke to pt, she has not pcp.  She is asking for advice for what to do with her issue of sleep.  She initially took amitriptyline for her migraines in 2016 when first seen and this did help with her sleep, but now since she is not taking this due to she is not having migraines.   She is also having processing issues worsening since the last 2 months.  I recommended to her to [facility].

## 2017-09-26 NOTE — Telephone Encounter (Signed)
LMVM for pt to return call.  If new problems, should be initially evaluated by pcp, and if is neuro related then will refer to us.  We have seen her in past for migraines.

## 2017-10-10 ENCOUNTER — Other Ambulatory Visit (HOSPITAL_BASED_OUTPATIENT_CLINIC_OR_DEPARTMENT_OTHER): Payer: Self-pay

## 2017-10-10 DIAGNOSIS — G4730 Sleep apnea, unspecified: Secondary | ICD-10-CM

## 2017-11-05 ENCOUNTER — Ambulatory Visit (HOSPITAL_BASED_OUTPATIENT_CLINIC_OR_DEPARTMENT_OTHER): Payer: Managed Care, Other (non HMO) | Attending: Nurse Practitioner | Admitting: Internal Medicine

## 2017-11-05 VITALS — Ht 68.0 in | Wt 261.0 lb

## 2017-11-05 DIAGNOSIS — I493 Ventricular premature depolarization: Secondary | ICD-10-CM | POA: Diagnosis not present

## 2017-11-05 DIAGNOSIS — G4730 Sleep apnea, unspecified: Secondary | ICD-10-CM

## 2017-11-05 DIAGNOSIS — E669 Obesity, unspecified: Secondary | ICD-10-CM | POA: Insufficient documentation

## 2017-11-05 DIAGNOSIS — G4763 Sleep related bruxism: Secondary | ICD-10-CM | POA: Insufficient documentation

## 2017-11-05 DIAGNOSIS — Z6841 Body Mass Index (BMI) 40.0 and over, adult: Secondary | ICD-10-CM | POA: Insufficient documentation

## 2017-11-05 DIAGNOSIS — R0681 Apnea, not elsewhere classified: Secondary | ICD-10-CM | POA: Diagnosis present

## 2017-11-14 DIAGNOSIS — G4730 Sleep apnea, unspecified: Secondary | ICD-10-CM

## 2017-11-14 NOTE — Procedures (Signed)
   Patient Name: [name] [name], [name] [name] Date: 11/05/2017 Gender: Female D.O.B: 11-18-1995 Age (years): 22 Referring Provider: Yolanda Johnson Height (inches): 68 Interpreting Physician: Jetty Duhamel MD, ABSM Weight (lbs): 261 RPSGT: Shelah Lewandowsky BMI: 40 MRN: [id number] Neck Size: 16.00  CLINICAL INFORMATION Sleep Study Type: NPSG Indication for sleep study: Fatigue, Obesity, Sleep walking/talking/parasomnias Epworth Sleepiness Score: 10  SLEEP STUDY TECHNIQUE As per the AASM Manual for the Scoring of Sleep and Associated Events v2.3 (April 2016) with a hypopnea requiring 4% desaturations.  The channels recorded and monitored were frontal, central and occipital EEG, electrooculogram (EOG), submentalis EMG (chin), nasal and oral airflow, thoracic and abdominal wall motion, anterior tibialis EMG, snore microphone, electrocardiogram, and pulse oximetry.  MEDICATIONS Medications self-administered by patient taken the night of the study : BENADRYL  SLEEP ARCHITECTURE The study was initiated at 10:46:15 PM and ended at 5:08:39 AM.  Sleep onset time was 13.5 minutes and the sleep efficiency was 90.3%%. The total sleep time was 345.5 minutes.  Stage REM latency was 96.5 minutes.  The patient spent 8.8%% of the night in stage N1 sleep, 68.6%% in stage N2 sleep, 0.0%% in stage N3 and 22.6% in REM.  Alpha intrusion was absent.  Supine sleep was 43.82%.  RESPIRATORY PARAMETERS The overall apnea/hypopnea index (AHI) was 0.2 per hour. There were 0 total apneas, including 0 obstructive, 0 central and 0 mixed apneas. There were 1 hypopneas and 7 RERAs.  The AHI during Stage REM sleep was 0.0 per hour.  AHI while supine was 0.4 per hour.  The mean oxygen saturation was 96.3%. The minimum SpO2 during sleep was 90.0%.  snoring was noted during this study.  CARDIAC DATA The 2 lead EKG demonstrated sinus rhythm. The mean heart rate was 84.4 beats per minute. Other EKG findings  include: PVCs.  LEG MOVEMENT DATA The total PLMS were 0 with a resulting PLMS index of 0.0. Associated arousal with leg movement index was 0.0 .  IMPRESSIONS - No significant obstructive sleep apnea occurred during this study (AHI = 0.2/h). - No significant central sleep apnea occurred during this study (CAI = 0.0/h). - The patient had minimal or no oxygen desaturation during the study (Min O2 = 90.0%) - No snoring was audible during this study. - EKG findings include frequent PVCs. - Clinically significant periodic limb movements did not occur during sleep. No significant associated arousals. - Frequent Bruxism noted. - The patient reported difficulty falling asleep in the home environment, but sleep architecture was unremarkable after benadryl on this study night.  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 - Sleep hygiene should be reviewed to assess factors that may improve sleep quality. - Weight management and regular exercise should be initiated or continued if appropriate.  [Electronically signed] 11/14/2017 03:43 PM  Jetty Duhamel MD, ABSM Diplomate, American Board of Sleep Medicine   NPI: [id number]                          Jetty Duhamel Diplomate, American Board of Sleep Medicine  ELECTRONICALLY SIGNED ON:  11/14/2017, 3:39 PM [facility] PH: [phone]   FX: [phone] ACCREDITED BY THE AMERICAN ACADEMY OF SLEEP MEDICINE

## 2017-11-27 ENCOUNTER — Institutional Professional Consult (permissible substitution): Payer: Managed Care, Other (non HMO) | Admitting: Neurology

## 2018-02-05 ENCOUNTER — Inpatient Hospital Stay (HOSPITAL_COMMUNITY)
Admission: AD | Admit: 2018-02-05 | Discharge: 2018-02-05 | Disposition: A | Discharge status: Home / Self Care | Payer: Managed Care, Other (non HMO) | Attending: Family Medicine | Admitting: Family Medicine

## 2018-02-05 ENCOUNTER — Encounter (HOSPITAL_COMMUNITY): Payer: Self-pay

## 2018-02-05 ENCOUNTER — Inpatient Hospital Stay (HOSPITAL_COMMUNITY): Payer: Managed Care, Other (non HMO)

## 2018-02-05 ENCOUNTER — Ambulatory Visit (HOSPITAL_COMMUNITY)
Admission: EM | Admit: 2018-02-05 | Discharge: 2018-02-05 | Disposition: A | Discharge status: Home / Self Care | Payer: Managed Care, Other (non HMO) | Source: Home / Self Care | Attending: Family Medicine | Admitting: Family Medicine

## 2018-02-05 DIAGNOSIS — R1031 Right lower quadrant pain: Secondary | ICD-10-CM

## 2018-02-05 DIAGNOSIS — R109 Unspecified abdominal pain: Secondary | ICD-10-CM | POA: Diagnosis not present

## 2018-02-05 DIAGNOSIS — Z3202 Encounter for pregnancy test, result negative: Secondary | ICD-10-CM | POA: Diagnosis not present

## 2018-02-05 HISTORY — DX: Unspecified ovarian cyst, unspecified side: N83.209

## 2018-02-05 LAB — URINALYSIS, ROUTINE W REFLEX MICROSCOPIC
Bacteria, UA: NONE SEEN
Bilirubin Urine: NEGATIVE
Glucose, UA: NEGATIVE mg/dL
Ketones, ur: NEGATIVE mg/dL
Leukocytes, UA: NEGATIVE
Nitrite: NEGATIVE
Protein, ur: NEGATIVE mg/dL
Specific Gravity, Urine: 1.026 (ref 1.005–1.030)
pH: 5 (ref 5.0–8.0)

## 2018-02-05 LAB — POCT PREGNANCY, URINE: Preg Test, Ur: NEGATIVE

## 2018-02-05 MED ORDER — KETOROLAC TROMETHAMINE 60 MG/2ML IM SOLN
60.0000 mg | Freq: Once | INTRAMUSCULAR | Status: AC
  Administered 2018-02-05: 60 mg via INTRAMUSCULAR
  Filled 2018-02-05: qty 2

## 2018-02-05 NOTE — MAU Provider Note (Signed)
History     CSN: [id number]  Arrival date and time: 02/05/18 2013   First Provider Initiated Contact with Patient 02/05/18 2050      Chief Complaint  Patient presents with  . Abdominal Pain   [name] [name] is a 23 y.o. G0P0 not currently pregnant who presents to MAU with complaints of abdominal pain. She reports abdominal pain started occurring around 1530 this afternoon, describes abdominal pain as sharp aching pain specific to right lateral side of abdomen. Rates pain 7-8/10- has not taken any medication for abdominal pain. Went to urgent care for assessment and triage nurse sent her here prior to evaluation by provider. She denies lower abdominal cramping or pain. She is currently on her cycle. Denies changes in partners.    OB History   No obstetric history on file.     Past Medical History:  Diagnosis Date  . Anxiety   . Asthma   . Depression   . Migraine   . Ovarian cyst   . Tonsillitis 10/2014    Past Surgical History:  Procedure Laterality Date  . ELBOW SURGERY Right 2007  . TONSILLECTOMY AND ADENOIDECTOMY Bilateral 11/23/2014   Procedure: TONSILLECTOMY AND ADENOIDECTOMY;  Surgeon: [name], MD;  Location: [facility];  Service: ENT;  Laterality: Bilateral;  . TURBINATE REDUCTION Bilateral 05/28/2017   Procedure: BILATERAL TURBINATE REDUCTION;  Surgeon: [name], MD;  Location: [facility];  Service: ENT;  Laterality: Bilateral;  . WISDOM TOOTH EXTRACTION      Family History  Problem Relation Age of Onset  . Hypertension Mother   . Cancer Maternal Aunt        breast  . Diabetes Paternal Grandfather     Social History   Tobacco Use  . Smoking status: Never Smoker  . Smokeless tobacco: Never Used  Substance Use Topics  . Alcohol use: Yes    Comment: socially  . Drug use: No    Comment: 09/23/14 twice a year    Allergies:  Allergies  Allergen Reactions  . Clindamycin/Lincomycin Hives  . Peanut-Containing  Drug Products Swelling    ALL NUTS Throat swelling, lip swelling, hives, vomiting  . Latex Rash    Medications Prior to Admission  Medication Sig Dispense Refill Last Dose  . acetaminophen (TYLENOL) 500 MG tablet Take 1 tablet (500 mg total) by mouth every 6 (six) hours as needed. 30 tablet 0 More than a month at Unknown time  . cetirizine (ZYRTEC) 10 MG tablet Take 10 mg by mouth daily.   05/27/2017 at Unknown time  . EPIPEN 2-PAK 0.3 MG/0.3ML SOAJ injection use as directed by prescriber  0 Unknown at Unknown time  . etonogestrel (NEXPLANON) 68 MG IMPL implant 1 each by Subdermal route once.     . hydrOXYzine (ATARAX/VISTARIL) 10 MG tablet TAKE 1 TO 3 TABLETS BY MOUTH EVERY 6-8 HOURS AS NEEDED FOR ITCHING  0 Past Month at Unknown time  . ibuprofen (ADVIL,MOTRIN) 800 MG tablet Take 1 tablet (800 mg total) by mouth 3 (three) times daily. 21 tablet 0 Past Month at Unknown time  . lidocaine (XYLOCAINE) 2 % solution Use as directed 15 mLs in the mouth or throat as needed for mouth pain. 100 mL 0   . predniSONE (DELTASONE) 20 MG tablet Two daily with food (Patient not taking: Reported on 08/27/2017) 10 tablet 0 Not Taking at Unknown time    Review of Systems  Constitutional: Negative.   Respiratory: Negative.   Cardiovascular: Negative.  Gastrointestinal: Positive for abdominal pain. Negative for constipation, diarrhea, nausea and vomiting.       Right lateral side pain  Genitourinary: Positive for flank pain.   Physical Exam   Blood pressure 140/75, pulse 73, temperature 98 F (36.7 C), temperature source Oral, resp. rate 18, height 5\' 8"  (1.727 m), weight 120.2 kg, last menstrual period 02/02/2018, SpO2 99 %.  Physical Exam  Constitutional: She is oriented to person, place, and time. She appears well-developed and well-nourished. No distress.  Cardiovascular: Normal rate and regular rhythm.  No murmur heard. Respiratory: Effort normal and breath sounds normal. No respiratory distress.  She has no wheezes. She has no rales.  GI: Soft. Bowel sounds are normal. She exhibits no distension. There is CVA tenderness. There is no rebound and no guarding.  CVA tenderness on right side   Musculoskeletal: Normal range of motion.        General: No edema.  Neurological: She is alert and oriented to person, place, and time.  Psychiatric: She has a normal mood and affect. Her behavior is normal. Thought content normal.    MAU Course  Procedures  MDM Orders Placed This Encounter  Procedures  . US RENAL  . Urinalysis, Routine w reflex microscopic  . Pregnancy, urine POC   Labs and US report reviewed:  Results for orders placed or performed during the hospital encounter of 02/05/18 (from the past 24 hour(s))  Pregnancy, urine POC     Status: None   Collection Time: 02/05/18  8:54 PM  Result Value Ref Range   Preg Test, Ur NEGATIVE NEGATIVE  Urinalysis, Routine w reflex microscopic     Status: Abnormal   Collection Time: 02/05/18  9:05 PM  Result Value Ref Range   Color, Urine YELLOW YELLOW   APPearance CLEAR CLEAR   Specific Gravity, Urine 1.026 1.005 - 1.030   pH 5.0 5.0 - 8.0   Glucose, UA NEGATIVE NEGATIVE mg/dL   Hgb urine dipstick MODERATE (A) NEGATIVE   Bilirubin Urine NEGATIVE NEGATIVE   Ketones, ur NEGATIVE NEGATIVE mg/dL   Protein, ur NEGATIVE NEGATIVE mg/dL   Nitrite NEGATIVE NEGATIVE   Leukocytes, UA NEGATIVE NEGATIVE   RBC / HPF 0-5 0 - 5 RBC/hpf   WBC, UA 0-5 0 - 5 WBC/hpf   Bacteria, UA NONE SEEN NONE SEEN   Squamous Epithelial / LPF 0-5 0 - 5   Mucus PRESENT    [place] Renal  Result Date: 02/05/2018 CLINICAL DATA:  Right-sided flank pain EXAM: RENAL / URINARY TRACT ULTRASOUND COMPLETE COMPARISON:  None. FINDINGS: Right Kidney: Renal measurements: 10.9 x 4.2 x 4.8 cm = volume: 115.2 mL . Echogenicity within normal limits. No mass or hydronephrosis visualized. Left Kidney: Renal measurements: 11 x 5.6 x 4.8 cm = volume: 156 mL. Echogenicity within normal  limits. No mass or hydronephrosis visualized. Bladder: Appears normal for degree of bladder distention. IMPRESSION: Negative renal ultrasound Electronically Signed   By: Jasmine PangKim  Fujinaga M.D.   On: 02/05/2018 22:09   Discussed results of US and findings with patient- negative renal study. Treatments in MAU included toradol 60mg  IM for abdominal pain, patient reports pain is relieved upon reassessment.   Encourage Hydration prior to and after hot yoga where pain initially began. Discussed reasons to return to MAU. Pt stable at time of discharge.   Assessment and Plan   1. Acute right flank pain   2. Abdominal pain, unspecified abdominal location    Discharge home  Hydration and rest  Follow  up with PCP or urgent care   Sharyon CableVeronica C Lyfe Reihl CNM 02/05/2018, 11:13 PM

## 2018-02-05 NOTE — MAU Note (Signed)
Pt here with c/o abdominal pain, lower right side. Was seen at the urgent care and was told to come here to r/o ovarian cyst or torsion.

## 2018-02-05 NOTE — ED Notes (Signed)
Pt here for abd pain; pt to go to ED for further eval per [facility]; pt agreeable

## 2018-02-05 NOTE — Discharge Instructions (Signed)
In late 2019, the [facility] will be moving to the [place] campus. At that time, the MAU (Maternity Admissions Unit), where you are being seen today, will no longer take care of non-pregnant patients. We strongly encourage you to find a doctor's office before that time, so that you can be seen with any GYN concerns, like vaginal discharge, urinary tract infection, etc.. in a timely manner. ° °In order to make an office visit more convenient, the [facility] will be offering evening hours with same-day appointments, walk-in appointments and scheduled appointments available during this time. ° °[facility] Hours: °Monday - 8am - 7:30 pm with walk-in between 4pm- 7:30 pm °Tuesday - 8 am - 5 pm (starting 04/10/17 we will be open late and accepting walk-ins from 4pm - 7:30pm) °Wednesday - 8 am - 5 pm (starting 07/11/17 we will be open late and accepting walk-ins from 4pm - 7:30pm) °Thursday 8 am - 5 pm (starting 10/11/17 we will be open late and accepting walk-ins from 4pm - 7:30pm) °Friday 8 am - 5 pm ° °For an appointment please call the [facility] at [phone] ° °For urgent needs, [facility] is also available for management of urgent GYN complaints such as vaginal discharge or urinary tract infections. ° ° ° ° ° °

## 2018-02-08 ENCOUNTER — Other Ambulatory Visit: Payer: Self-pay

## 2018-02-08 ENCOUNTER — Encounter (HOSPITAL_COMMUNITY): Payer: Self-pay | Admitting: *Deleted

## 2018-02-08 ENCOUNTER — Emergency Department (HOSPITAL_COMMUNITY)
Admission: EM | Admit: 2018-02-08 | Discharge: 2018-02-08 | Disposition: A | Discharge status: Home / Self Care | Payer: Managed Care, Other (non HMO) | Attending: Emergency Medicine | Admitting: Emergency Medicine

## 2018-02-08 ENCOUNTER — Emergency Department (HOSPITAL_COMMUNITY): Payer: Managed Care, Other (non HMO)

## 2018-02-08 DIAGNOSIS — R102 Pelvic and perineal pain: Secondary | ICD-10-CM | POA: Insufficient documentation

## 2018-02-08 DIAGNOSIS — E860 Dehydration: Secondary | ICD-10-CM | POA: Insufficient documentation

## 2018-02-08 DIAGNOSIS — Z79899 Other long term (current) drug therapy: Secondary | ICD-10-CM | POA: Insufficient documentation

## 2018-02-08 DIAGNOSIS — K5900 Constipation, unspecified: Secondary | ICD-10-CM

## 2018-02-08 DIAGNOSIS — R109 Unspecified abdominal pain: Secondary | ICD-10-CM | POA: Diagnosis present

## 2018-02-08 DIAGNOSIS — Z91040 Latex allergy status: Secondary | ICD-10-CM | POA: Diagnosis not present

## 2018-02-08 DIAGNOSIS — Z91010 Allergy to peanuts: Secondary | ICD-10-CM | POA: Diagnosis not present

## 2018-02-08 DIAGNOSIS — J45909 Unspecified asthma, uncomplicated: Secondary | ICD-10-CM | POA: Diagnosis not present

## 2018-02-08 LAB — COMPREHENSIVE METABOLIC PANEL
ALT: 37 U/L (ref 0–44)
AST: 54 U/L — ABNORMAL HIGH (ref 15–41)
Albumin: 4 g/dL (ref 3.5–5.0)
Alkaline Phosphatase: 47 U/L (ref 38–126)
Anion gap: 7 (ref 5–15)
BUN: 13 mg/dL (ref 6–20)
CO2: 21 mmol/L — ABNORMAL LOW (ref 22–32)
Calcium: 9.5 mg/dL (ref 8.9–10.3)
Chloride: 109 mmol/L (ref 98–111)
Creatinine, Ser: 1 mg/dL (ref 0.44–1.00)
GFR calc Af Amer: >60 mL/min (ref >60)
Glucose, Bld: 104 mg/dL — ABNORMAL HIGH (ref 70–99)
Potassium: 3.4 mmol/L — ABNORMAL LOW (ref 3.5–5.1)
Sodium: 137 mmol/L (ref 135–145)
Total Bilirubin: 1.2 mg/dL (ref 0.3–1.2)
Total Protein: 7.4 g/dL (ref 6.5–8.1)

## 2018-02-08 LAB — URINALYSIS, ROUTINE W REFLEX MICROSCOPIC
Bilirubin Urine: NEGATIVE
Glucose, UA: NEGATIVE mg/dL
Ketones, ur: NEGATIVE mg/dL
Leukocytes, UA: NEGATIVE
NITRITE: NEGATIVE
PH: 5 (ref 5.0–8.0)
Protein, ur: 30 mg/dL — AB
Specific Gravity, Urine: 1.01 (ref 1.005–1.030)

## 2018-02-08 LAB — CBC
HCT: 37.8 % (ref 36.0–46.0)
Hemoglobin: 12.8 g/dL (ref 12.0–15.0)
MCH: 31.1 pg (ref 26.0–34.0)
MCHC: 33.9 g/dL (ref 30.0–36.0)
MCV: 91.7 fL (ref 80.0–100.0)
Platelets: 295 10*3/uL (ref 150–400)
RBC: 4.12 MIL/uL (ref 3.87–5.11)
RDW: 13.4 % (ref 11.5–15.5)
WBC: 8.9 10*3/uL (ref 4.0–10.5)
nRBC: 0 % (ref 0.0–0.2)

## 2018-02-08 LAB — I-STAT BETA HCG BLOOD, ED (MC, WL, AP ONLY): I-stat hCG, quantitative: <5 m[IU]/mL (ref <5)

## 2018-02-08 LAB — LIPASE, BLOOD: Lipase: 28 U/L (ref 11–51)

## 2018-02-08 MED ORDER — IOPAMIDOL (ISOVUE-300) INJECTION 61%
100.0000 mL | Freq: Once | INTRAVENOUS | Status: AC | PRN
  Administered 2018-02-08: 100 mL via INTRAVENOUS

## 2018-02-08 MED ORDER — KETOROLAC TROMETHAMINE 30 MG/ML IJ SOLN
30.0000 mg | Freq: Once | INTRAMUSCULAR | Status: AC
  Administered 2018-02-08: 30 mg via INTRAVENOUS
  Filled 2018-02-08: qty 1

## 2018-02-08 MED ORDER — SODIUM CHLORIDE 0.9 % IV BOLUS
1000.0000 mL | Freq: Once | INTRAVENOUS | Status: AC
  Administered 2018-02-08: 1000 mL via INTRAVENOUS

## 2018-02-08 MED ORDER — SODIUM CHLORIDE (PF) 0.9 % IJ SOLN
INTRAMUSCULAR | Status: AC
  Filled 2018-02-08: qty 50

## 2018-02-08 MED ORDER — CEPHALEXIN 500 MG PO CAPS: 500.0000 mg | ORAL_CAPSULE | Freq: Three times a day (TID) | ORAL | 0 refills | Status: AC

## 2018-02-08 MED ORDER — FLUCONAZOLE 150 MG PO TABS: ORAL_TABLET | ORAL | 0 refills | Status: AC

## 2018-02-08 MED ORDER — SODIUM CHLORIDE 0.9% FLUSH: 3.0000 mL | Freq: Once | INTRAVENOUS | Status: DC

## 2018-02-08 MED ORDER — IOPAMIDOL (ISOVUE-300) INJECTION 61%
INTRAVENOUS | Status: AC
  Filled 2018-02-08: qty 100

## 2018-02-08 NOTE — ED Provider Notes (Addendum)
[facility]-EMERGENCY DEPT Provider Note   CSN: [id number] Arrival date & time: 02/08/18  0119  Time seen 3:47 AM   History   Chief Complaint Chief Complaint  Patient presents with  . Abdominal Pain    HPI [name] [name] is a 23 y.o. female.  HPI patient states she has been having right-sided abdominal pain since January 28.  She states the pain has been there constantly.  She states movement, breathing, and sneezing makes it hurt more.  Nothing makes it feel better and she has tried Motrin and Tylenol.  She states her last normal period started Saturday, January 26.  She states she has had a Nexplanon since 2017 and this is only her third.  She has had.  She denies nausea, vomiting, fever, diarrhea, dysuria, or frequency.  She was seen at [facility] on January 28 for the same pain and had a ultrasound of her kidneys done that was normal.  They told her she was dehydrated and needed to drink more fluids.  She states they gave her Toradol which helped her pain but did not make it go away.  She states she has had a normal appetite.  She states she is constipated today and states her last bowel movement was today and she was having hard stool and balls.  Constipation is not a long-term problem for her.  She states she had a ovarian cyst on the left before and this pain now is starting to feel similar although it is on the other side.  PCP Patient, No Pcp Per     Past Medical History:  Diagnosis Date  . Anxiety   . Asthma   . Depression   . Migraine   . Ovarian cyst   . Tonsillitis 10/2014    Patient Active Problem List   Diagnosis Date Noted  . Chronic tonsillitis 11/23/2014  . 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 ELBOW SURGERY Right 2007  . TONSILLECTOMY AND ADENOIDECTOMY Bilateral 11/23/2014   Procedure: TONSILLECTOMY AND ADENOIDECTOMY;  Surgeon: [name], MD;  Location:  [facility];  Service: ENT;  Laterality: Bilateral;  . TURBINATE REDUCTION Bilateral 05/28/2017   Procedure: BILATERAL TURBINATE REDUCTION;  Surgeon: [name], MD;  Location: [facility];  Service: ENT;  Laterality: Bilateral;  . WISDOM TOOTH EXTRACTION       OB History   No obstetric history on file.      Home Medications    Prior to Admission medications   Medication Sig Start Date End Date Taking? Authorizing Provider  EPIPEN 2-PAK 0.3 MG/0.3ML SOAJ injection use as directed by prescriber 08/05/14  Yes [provider]  sertraline (ZOLOFT) 50 MG tablet Take 50 mg by mouth daily. 01/10/18  Yes [provider]  traZODone (DESYREL) 50 MG tablet Take 50-100 mg by mouth at bedtime as needed for sleep.  01/10/18  Yes [provider]  acetaminophen (TYLENOL) 500 MG tablet Take 1 tablet (500 mg total) by mouth every 6 (six) hours as needed. Patient not taking: Reported on 02/08/2018 10/31/14   Mady Gemma, PA-C  cephALEXin (KEFLEX) 500 MG capsule Take 1 capsule (500 mg total) by mouth 3 (three) times daily. 02/08/18   Devoria Albe, MD  ibuprofen (ADVIL,MOTRIN) 800 MG tablet Take 1 tablet (800 mg total) by mouth 3 (three) times daily. Patient not taking: Reported on 02/08/2018 10/31/14   Mady Gemma, PA-C  lidocaine (XYLOCAINE) 2 % solution Use as directed 15 mLs in the mouth or throat as needed for mouth pain. Patient not taking: Reported on 02/08/2018 08/27/17   Janace ArisBast, Traci A, NP  predniSONE (DELTASONE) 20 MG tablet Two daily with food Patient not taking: Reported on 08/27/2017 04/10/17   Elvina SidleLauenstein, Kurt, MD    Family History Family History  Problem Relation Age of Onset  . Hypertension Mother   . Cancer Maternal Aunt        breast  . Diabetes Paternal Grandfather     Social History Social History   Tobacco Use  . Smoking status: Never Smoker  . Smokeless tobacco: Never Used  Substance Use Topics  . Alcohol  use: Yes    Comment: socially  . 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    Physical Exam Updated Vital Signs BP (!) 125/93   Pulse 91   Temp 98.7 F (37.1 C) (Oral)   Resp 16   Ht 5\' 8"  (1.727 m)   Wt 117.9 kg   LMP 02/02/2018 (Approximate) Comment: negative HCG quantitative 02/08/18  SpO2 100%   BMI 39.53 kg/m   Vital signs normal    Physical Exam Vitals signs and nursing note reviewed.  Constitutional:      General: She is not in acute distress.    Appearance: Normal appearance. She is well-developed. She is not ill-appearing or toxic-appearing.  HENT:     Head: Normocephalic and atraumatic.     Right Ear: External ear normal.     Left Ear: External ear normal.     Nose: Nose normal. No mucosal edema or rhinorrhea.     Mouth/Throat:     Dentition: No dental abscesses.     Pharynx: No uvula swelling.  Eyes:     Conjunctiva/sclera: Conjunctivae normal.     Pupils: Pupils are equal, round, and reactive to light.  Neck:     Musculoskeletal: Full passive range of motion without pain, normal range of motion and neck supple.  Cardiovascular:     Rate and Rhythm: Normal rate and regular rhythm.     Heart sounds: Normal heart sounds. No murmur. No friction rub. No gallop.   Pulmonary:     Effort: Pulmonary effort is normal. No respiratory distress.     Breath sounds: Normal breath sounds. No wheezing, rhonchi or rales.  Chest:     Chest wall: No tenderness or crepitus.  Abdominal:     General: Bowel sounds are normal. There is no distension.     Palpations: Abdomen is soft.     Tenderness: There is abdominal tenderness. There is no guarding or rebound.       Comments: Area patient states hurts is  noted, areas that were tender as noted.  Musculoskeletal: Normal range of motion.        General: No tenderness.     Comments: Moves all extremities well.   Skin:    General: Skin is warm and dry.     Coloration: Skin is not pale.     Findings: No erythema or rash.  Neurological:     Mental Status: She is alert and oriented to person, place, and time.  Cranial Nerves: No cranial nerve deficit.  Psychiatric:        Mood and Affect: Mood is not anxious.        Speech: Speech normal.        Behavior: Behavior normal.      ED Treatments / Results  Labs (all labs ordered are listed, but only abnormal results are displayed) Results for orders placed or performed during the hospital encounter of 02/08/18  Lipase, blood  Result Value Ref Range   Lipase 28 11 - 51 U/L  Comprehensive metabolic panel  Result Value Ref Range   Sodium 137 135 - 145 mmol/L   Potassium 3.4 (L) 3.5 - 5.1 mmol/L   Chloride 109 98 - 111 mmol/L   CO2 21 (L) 22 - 32 mmol/L   Glucose, Bld 104 (H) 70 - 99 mg/dL   BUN 13 6 - 20 mg/dL   Creatinine, Ser 0.16 0.44 - 1.00 mg/dL   Calcium 9.5 8.9 - 55.3 mg/dL   Total Protein 7.4 6.5 - 8.1 g/dL   Albumin 4.0 3.5 - 5.0 g/dL   AST 54 (H) 15 - 41 U/L   ALT 37 0 - 44 U/L   Alkaline Phosphatase 47 38 - 126 U/L   Total Bilirubin 1.2 0.3 - 1.2 mg/dL   GFR calc non Af Amer >60 >60 mL/min   GFR calc Af Amer >60 >60 mL/min   Anion gap 7 5 - 15  CBC  Result Value Ref Range   WBC 8.9 4.0 - 10.5 K/uL   RBC 4.12 3.87 - 5.11 MIL/uL   Hemoglobin 12.8 12.0 - 15.0 g/dL   HCT 74.8 27.0 - 78.6 %   MCV 91.7 80.0 - 100.0 fL   MCH 31.1 26.0 - 34.0 pg   MCHC 33.9 30.0 - 36.0 g/dL   RDW 75.4 49.2 - 01.0 %   Platelets 295 150 - 400 K/uL   nRBC 0.0 0.0 - 0.2 %  Urinalysis, Routine w reflex microscopic  Result Value Ref Range   Color, Urine YELLOW YELLOW   APPearance HAZY (A) CLEAR   Specific Gravity, Urine 1.010 1.005 - 1.030   pH 5.0 5.0 - 8.0   Glucose, UA NEGATIVE  NEGATIVE mg/dL   Hgb urine dipstick MODERATE (A) NEGATIVE   Bilirubin Urine NEGATIVE NEGATIVE   Ketones, ur NEGATIVE NEGATIVE mg/dL   Protein, ur 30 (A) NEGATIVE mg/dL   Nitrite NEGATIVE NEGATIVE   Leukocytes, UA NEGATIVE NEGATIVE   RBC / HPF 0-5 0 - 5 RBC/hpf   WBC, UA 0-5 0 - 5 WBC/hpf   Bacteria, UA RARE (A) NONE SEEN   Squamous Epithelial / LPF 6-10 0 - 5   Mucus PRESENT    Hyaline Casts, UA PRESENT    Amorphous Crystal PRESENT   I-Stat beta hCG blood, ED  Result Value Ref Range   I-stat hCG, quantitative <5.0 <5 mIU/mL   Comment 3           Laboratory interpretation all normal except mild hypokalemia    EKG None  Radiology Ct Abdomen Pelvis W Contrast  Result Date: 02/08/2018 CLINICAL DATA:  Right-sided abdominal pain for 2 days EXAM: CT ABDOMEN AND PELVIS WITH CONTRAST TECHNIQUE: Multidetector CT imaging of the abdomen and pelvis was performed using the standard protocol following bolus administration of intravenous contrast. CONTRAST:  ISOVUE-300 IOPAMIDOL (ISOVUE-300) INJECTION 61% COMPARISON:  None. FINDINGS: Lower chest:  No contributory findings. Hepatobiliary: No focal liver abnormality.No evidence of biliary obstruction or stone.  Pancreas: Unremarkable. Spleen: Unremarkable. Adrenals/Urinary Tract: Negative adrenals. No hydronephrosis or stone. Asymmetric patchy hypoenhancement of the right renal cortex, see reformats. No abscess. Unremarkable bladder. Stomach/Bowel:  No obstruction. No appendicitis. Vascular/Lymphatic: No acute vascular abnormality. No mass or adenopathy. Reproductive:Negative. Other: No ascites or pneumoperitoneum. Musculoskeletal: No acute abnormalities. IMPRESSION: 1. Question pyelonephritis on the right. No abscess or hydronephrosis. 2. Normal appendix Electronically Signed   By: Marnee SpringJonathon  Watts M.D.   On: 02/08/2018 05:35      [place] Renal  Result Date: 02/05/2018 CLINICAL DATA:  Right-sided flank pain  IMPRESSION: Negative renal ultrasound  Electronically Signed   By: Jasmine PangKim  Fujinaga M.D.   On: 02/05/2018 22:09   Procedures Procedures (including critical care time)  Medications Ordered in ED Medications  sodium chloride flush (NS) 0.9 % injection 3 mL (3 mLs Intravenous Not Given 02/08/18 0230)  iopamidol (ISOVUE-300) 61 % injection (has no administration in time range)  sodium chloride (PF) 0.9 % injection (has no administration in time range)  sodium chloride 0.9 % bolus 1,000 mL (1,000 mLs Intravenous New Bag/Given 02/08/18 0429)  ketorolac (TORADOL) 30 MG/ML injection 30 mg (30 mg Intravenous Given 02/08/18 0429)  iopamidol (ISOVUE-300) 61 % injection 100 mL (100 mLs Intravenous Contrast Given 02/08/18 0449)     Initial Impression / Assessment and Plan / ED Course  I have reviewed the triage vital signs and the nursing notes.  Pertinent labs & imaging results that were available during my care of the patient were reviewed by me and considered in my medical decision making (see chart for details).     I consider her pain could be just from constipation with stool in the ascending colon, ovarian cyst, or even appendicitis although she does not have nausea, vomiting, fever, or elevated white blood cell count.  CT abdomen was done.  6:14 AM patient CT was reviewed.  Although the radiologist says is questionable for pyelonephritis her urine is normal.  Patient does admit to constipation.  I will tell her what to do for that and put her on Keflex just in case.  When I review her CT scan she has lots of stool in the right colon.  Final Clinical Impressions(s) / ED Diagnoses   Final diagnoses:  Abdominal pain, unspecified abdominal location  Constipation, unspecified constipation type    ED Discharge Orders         Ordered    cephALEXin (KEFLEX) 500 MG capsule  3 times daily     02/08/18 0617        OTC miralax  Plan discharge  Devoria AlbeIva Reshard Guillet, MD, Concha PyoFACEP    Aiyden Lauderback, MD 02/08/18 [id number]    Devoria AlbeKnapp, Derwin Reddy, MD 02/08/18  [phone]

## 2018-02-08 NOTE — Discharge Instructions (Addendum)
Take the antibiotic until gone, the radiologist thought your right kidney looked a little inflamed although your urine looked normal.  When I review your CT scan you have lots of stool especially in your right colon.Get miralax and put one dose or 17 g in 8 ounces of water,  take 1 dose every 30 minutes for 2-3 hours or until you  get good results and then once or twice daily to prevent constipation.

## 2018-02-08 NOTE — ED Triage Notes (Signed)
Pt stated "I was seen @ [facility] on Tuesday, they sent me to [facility] because they thought it may be ovarian.  They only did an [place] on my kidneys, a urine sample and told me to drink water.  I have pain."  Pt indicates pain to RLQ.  Pt does c/o constipation.

## 2018-02-08 NOTE — ED Notes (Signed)
Patient given discharge teaching and verbalized understanding. Patient ambulated out of ED with a steady gait. 

## 2018-02-08 NOTE — ED Notes (Signed)
Patient transported to CT 

## 2018-02-13 NOTE — ED Provider Notes (Signed)
[facility]   [id number] 02/05/18 Arrival Time: 1859  ASSESSMENT & PLAN:  1. Abdominal pain, acute, right lower quadrant    Given the abrupt onset of abdominal pain and the amount of pain she is in, will send her to the ED for evaluation. Cannot r/o ovarian or appendix etiology. Discussed.  Follow-up Information    Go to  [facility] EMERGENCY DEPARTMENT.   Specialty:  Emergency Medicine Contact information: [address] [id number] Wilhemina Bonito [place] [address] [phone]         Reviewed expectations re: course of current medical issues. Questions answered. Outlined signs and symptoms indicating need for more acute intervention. Patient verbalized understanding. After Visit Summary given.   SUBJECTIVE: History from: patient. [name] [name] is a 23 y.o. female who presents with complaint of fairly persistent RLQ abdominal discomfort. Onset abrupt, today. Discomfort described as aching; without radiation. Symptoms are unchanged since beginning. Fever: absent. Aggravating factors: have not been identified. Alleviating factors: have not been identified. Associated symptoms: mild nausea without emesis. She denies arthralgias, belching, chills, constipation, diarrhea, fever, headache and sweats. Appetite: normal. PO intake: decreased. Ambulatory without assistance. Urinary symptoms: none. Bowel movements: have not significantly changed; last bowel movement within the past 1-2 days and without blood. History of similar: no. OTC treatment: Tylenol without much relief.  Patient's last menstrual period was 02/02/2018 (approximate).   Past Surgical History:  Procedure Laterality Date  . ELBOW SURGERY Right 2007  . TONSILLECTOMY AND ADENOIDECTOMY Bilateral 11/23/2014   Procedure: TONSILLECTOMY AND ADENOIDECTOMY;  Surgeon: [name], MD;  Location: [facility];  Service: ENT;  Laterality: Bilateral;  . TURBINATE REDUCTION  Bilateral 05/28/2017   Procedure: BILATERAL TURBINATE REDUCTION;  Surgeon: [name], MD;  Location: [facility];  Service: ENT;  Laterality: Bilateral;  . WISDOM TOOTH EXTRACTION     ROS: As per HPI. All other systems negative.  OBJECTIVE:  Vitals:   02/05/18 1947  BP: 129/86  Pulse: 70  Resp: 20  Temp: 98.2 F (36.8 C)  TempSrc: Oral  SpO2: 99%    General appearance: alert, oriented, appears uncomfortable Lungs: clear to auscultation bilaterally; unlabored respirations Heart: regular rate and rhythm Abdomen: soft; without distention; vague but significant tenderness over her RLQ to palpation; normal bowel sounds; without masses or organomegaly; without guarding or rebound tenderness Back: without CVA tenderness; FROM at waist Extremities: without LE edema; symmetrical; without gross deformities Skin: warm and dry Neurologic: normal gait Psychological: alert and cooperative; normal mood and affect   Allergies  Allergen Reactions  . Clindamycin/Lincomycin Hives  . Peanut-Containing Drug Products Swelling    ALL NUTS Throat swelling, lip swelling, hives, vomiting  . Latex Rash                                               Past Medical History:  Diagnosis Date  . Anxiety   . Asthma   . Depression   . Migraine   . Ovarian cyst   . Tonsillitis 10/2014   Social History   Socioeconomic History  . Marital status: Single    Spouse name: Not on file  . Number of children: 0  . Years of education: Not on file  . Highest education level: Not on file  Occupational History    Comment: college student, [employer]  Social Needs  .  Financial resource strain: Not on file  . Food insecurity:    Worry: Not on file    Inability: Not on file  . Transportation needs:    Medical: Not on file    Non-medical: Not on file  Tobacco Use  . Smoking status: Never Smoker  . Smokeless tobacco: Never Used  Substance and Sexual Activity  . Alcohol use: Yes    Comment:  socially  . Drug use: No    Comment: 09/23/14 twice a year  . Sexual activity: Yes    Birth control/protection: Implant  Lifestyle  . Physical activity:    Days per week: Not on file    Minutes per session: Not on file  . Stress: Not on file  Relationships  . Social connections:    Talks on phone: Not on file    Gets together: Not on file    Attends religious service: Not on file    Active member of club or organization: Not on file    Attends meetings of clubs or organizations: Not on file    Relationship status: Not on file  . Intimate partner violence:    Fear of current or ex partner: Not on file    Emotionally abused: Not on file    Physically abused: Not on file    Forced sexual activity: Not on file  Other Topics Concern  . Not on file  Social History Narrative   Lives on campus   Caffeine use- very little   Family History  Problem Relation Age of Onset  . Hypertension Mother   . Cancer Maternal Aunt        breast  . Diabetes Paternal Glynda Jaeger, MD 02/13/18 [phone]

## 2019-02-22 ENCOUNTER — Other Ambulatory Visit: Payer: Self-pay

## 2019-02-22 ENCOUNTER — Encounter (HOSPITAL_COMMUNITY): Payer: Self-pay

## 2019-02-22 ENCOUNTER — Emergency Department (HOSPITAL_COMMUNITY): Payer: Managed Care, Other (non HMO)

## 2019-02-22 ENCOUNTER — Emergency Department (HOSPITAL_COMMUNITY)
Admission: EM | Admit: 2019-02-22 | Discharge: 2019-02-22 | Disposition: A | Discharge status: Home / Self Care | Payer: Managed Care, Other (non HMO) | Attending: Emergency Medicine | Admitting: Emergency Medicine

## 2019-02-22 DIAGNOSIS — M545 Low back pain, unspecified: Secondary | ICD-10-CM

## 2019-02-22 DIAGNOSIS — Y999 Unspecified external cause status: Secondary | ICD-10-CM | POA: Diagnosis not present

## 2019-02-22 DIAGNOSIS — Y9289 Other specified places as the place of occurrence of the external cause: Secondary | ICD-10-CM | POA: Insufficient documentation

## 2019-02-22 DIAGNOSIS — Y9389 Activity, other specified: Secondary | ICD-10-CM | POA: Insufficient documentation

## 2019-02-22 DIAGNOSIS — J45909 Unspecified asthma, uncomplicated: Secondary | ICD-10-CM | POA: Diagnosis not present

## 2019-02-22 DIAGNOSIS — M25511 Pain in right shoulder: Secondary | ICD-10-CM | POA: Insufficient documentation

## 2019-02-22 DIAGNOSIS — M25562 Pain in left knee: Secondary | ICD-10-CM | POA: Diagnosis not present

## 2019-02-22 DIAGNOSIS — W001XXA Fall from stairs and steps due to ice and snow, initial encounter: Secondary | ICD-10-CM | POA: Diagnosis not present

## 2019-02-22 DIAGNOSIS — W19XXXA Unspecified fall, initial encounter: Secondary | ICD-10-CM

## 2019-02-22 DIAGNOSIS — M5489 Other dorsalgia: Secondary | ICD-10-CM | POA: Diagnosis present

## 2019-02-22 LAB — PREGNANCY, URINE: Preg Test, Ur: NEGATIVE

## 2019-02-22 MED ORDER — DICLOFENAC SODIUM 1 % EX GEL: 2.0000 g | Freq: Four times a day (QID) | CUTANEOUS | 0 refills | Status: AC | PRN

## 2019-02-22 MED ORDER — IBUPROFEN 800 MG PO TABS: 800.0000 mg | ORAL_TABLET | Freq: Three times a day (TID) | ORAL | 0 refills | Status: AC | PRN

## 2019-02-22 NOTE — Discharge Instructions (Signed)

## 2019-02-22 NOTE — ED Provider Notes (Signed)
Emergency Department Provider Note   I have reviewed the triage vital signs and the nursing notes.   HISTORY  Chief Complaint Back Pain, Shoulder Pain, and Knee Pain   HPI [name] [name] is a 24 y.o. female with PMH of asthma presents to the emergency department for evaluation after reported slip and fall down multiple steps.  Patient has lower back pain along with right shoulder and left knee discomfort.  She tells me that the steps were icy and then she slipped and fell near the top of 2 flights of stairs.  She fell down approximately 10 steps, sliding to the bottom.  She denies head injury or loss of consciousness.  She has had pain in her lower back, left knee, right shoulder.  No numbness/weakness in the lower extremities.  She ultimately called EMS and arrives to the emergency department for evaluation.  She denies any near-syncope symptoms prior to falling.   Past Medical History:  Diagnosis Date  . Anxiety   . Asthma   . Depression   . Migraine   . Ovarian cyst   . Tonsillitis 10/2014    Patient Active Problem List   Diagnosis Date Noted  . Chronic tonsillitis 11/23/2014  . 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 ELBOW SURGERY Right 2007  . TONSILLECTOMY AND ADENOIDECTOMY Bilateral 11/23/2014   Procedure: TONSILLECTOMY AND ADENOIDECTOMY;  Surgeon: [name], MD;  Location: [facility];  Service: ENT;  Laterality: Bilateral;  . TURBINATE REDUCTION Bilateral 05/28/2017   Procedure: BILATERAL TURBINATE REDUCTION;  Surgeon: [name], MD;  Location: [facility];  Service: ENT;  Laterality: Bilateral;  . WISDOM TOOTH EXTRACTION      Allergies Clindamycin/lincomycin, Peanut-containing drug products, and Latex  Family History  Problem Relation Age of Onset  . Hypertension Mother   . Cancer Maternal Aunt        breast  . Diabetes Paternal Grandfather     Social  History Social History   Tobacco Use  . Smoking status: Never Smoker  . Smokeless tobacco: Never Used  Substance Use Topics  . Alcohol use: Yes    Comment: socially  . Drug use: No    Comment: 09/23/14 twice a year    Review of Systems  Constitutional: No fever/chills Cardiovascular: Denies chest pain. Respiratory: Denies shortness of breath. Gastrointestinal: No abdominal pain.   Musculoskeletal: Positive lower back pain, left knee pain, and right shoulder pain.  Skin: Negative for rash. Neurological: Negative for headaches, focal weakness or numbness.  10-point ROS otherwise negative.  ____________________________________________   PHYSICAL EXAM:  VITAL SIGNS: ED Triage Vitals [02/22/19 0916]  Enc Vitals Group     BP (!) 149/100     Pulse Rate 96     Resp 16     Temp 98.2 F (36.8 C)     Temp Source Oral     SpO2 96 %   Constitutional: Alert and oriented. Well appearing and in no acute distress. Eyes: Conjunctivae are normal. Head: Atraumatic. Nose: No congestion/rhinnorhea. Mouth/Throat: Mucous membranes are moist.  Neck: No stridor. No cervical spine tenderness to palpation. Cardiovascular: Normal rate, regular rhythm. Good peripheral circulation. Grossly normal heart sounds.   Respiratory: Normal respiratory effort.  No retractions. Lungs CTAB. Gastrointestinal: Soft and nontender. No distention.  Musculoskeletal: Patient with mild posterior right shoulder tenderness with normal range of motion of the right shoulder, elbow, wrist.  No bruising or obvious  deformity.  Patient with mild tenderness to the left lateral knee without bruising or deformity.  Normal range of motion of the bilateral hips, knees, ankles.  Mild midline and paraspinal tenderness in the lumbar region.  No tenderness to palpation of the thoracic spine.  Neurologic:  Normal speech and language. No gross focal neurologic deficits are appreciated.  Skin:  Skin is warm, dry and intact. No rash  noted.  ____________________________________________   LABS (all labs ordered are listed, but only abnormal results are displayed)  Labs Reviewed  PREGNANCY, URINE   ____________________________________________  RADIOLOGY  DG Lumbar Spine 2-3 Views  Result Date: 02/22/2019 CLINICAL DATA:  Larey Seat down steps. EXAM: LUMBAR SPINE - 2-3 VIEW COMPARISON:  CT abdomen pelvis 02/08/2018 FINDINGS: Normal alignment. Vertebral body heights and intervertebral disc spaces are maintained. No evidence of acute fracture. No significant degenerative changes. No focal bony lesion. SI joints are open. No acute finding in the visualized skeleton. Nonobstructive bowel gas pattern. IMPRESSION: Negative radiographs of the lumbar spine. Electronically Signed   By: Emmaline Kluver M.D.   On: 02/22/2019 11:44   DG Shoulder Right  Result Date: 02/22/2019 CLINICAL DATA:  Larey Seat down steps. EXAM: RIGHT SHOULDER - 2+ VIEW COMPARISON:  None. FINDINGS: There is no evidence of fracture or dislocation. There is no evidence of arthropathy or other focal bone abnormality. Soft tissues are unremarkable. IMPRESSION: Negative radiographs of the right shoulder. Electronically Signed   By: Emmaline Kluver M.D.   On: 02/22/2019 11:41   DG Knee Complete 4 Views Left  Result Date: 02/22/2019 CLINICAL DATA:  Larey Seat down steps. EXAM: LEFT KNEE - COMPLETE 4+ VIEW COMPARISON:  None. FINDINGS: No evidence of fracture, dislocation, or joint effusion. No evidence of arthropathy or other focal bone abnormality. Soft tissues are unremarkable. IMPRESSION: Negative radiographs of the left knee. Electronically Signed   By: Emmaline Kluver M.D.   On: 02/22/2019 11:44    ____________________________________________   PROCEDURES  Procedure(s) performed:   Procedures  None  ____________________________________________   INITIAL IMPRESSION / ASSESSMENT AND PLAN / ED COURSE  Pertinent labs & imaging results that were available during my  care of the patient were reviewed by me and considered in my medical decision making (see chart for details).   Patient presents to the emergency department after mechanical fall down multiple steps.  She has reported pain in several areas without neuro deficit, obvious deformity, or bruising.  Given mechanism, plan for plain films of the right shoulder, left knee, lumbar spine.   12:40 PM  Plain films reviewed and discussed with the patient.  No acute findings.  Patient has taken Motrin in the past without difficulty and will take this again along with Tylenol as needed for pain.  Voltaren also prescribed for additional discomfort.  Discussed plan for PCP follow-up and ED return precautions should symptoms worsen. ____________________________________________  FINAL CLINICAL IMPRESSION(S) / ED DIAGNOSES  Final diagnoses:  Fall, initial encounter  Acute midline low back pain without sciatica  Acute pain of right shoulder  Acute pain of left knee    NEW OUTPATIENT MEDICATIONS STARTED DURING THIS VISIT:  New Prescriptions   DICLOFENAC SODIUM (VOLTAREN) 1 % GEL    Apply 2 g topically 4 (four) times daily as needed (pain).   IBUPROFEN (ADVIL) 800 MG TABLET    Take 1 tablet (800 mg total) by mouth every 8 (eight) hours as needed for moderate pain.    Note:  This document was prepared using Sales executive  software and may include unintentional dictation errors.  Nanda Quinton, MD, [facility] Emergency Medicine    Kashden Deboy, [facility], MD 02/22/19 1240

## 2019-02-22 NOTE — ED Notes (Signed)
Patient reports mid-back pain since 0530 today. Patient fell down 2 flights of stairs this morning d/t [place] at apartment complex. Patient went to [facility] but reports no x-rays/scans were done.

## 2019-02-22 NOTE — ED Triage Notes (Signed)
Pt states fell down stairs (approx 10 stairs) landed on back, c/o middle lower back, right shoulder and left knee. Pt ambulatory on arrival
